# Patient Record
Sex: Male | Born: 2005
Health system: Southern US, Community
[De-identification: ages and names within clinical notes are randomized; demographics above are authoritative.]

---
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
## 2023-02-22 ENCOUNTER — Other Ambulatory Visit: Payer: Self-pay

## 2023-02-22 DIAGNOSIS — H6993 Unspecified Eustachian tube disorder, bilateral: Secondary | ICD-10-CM | POA: Diagnosis not present

## 2023-02-22 DIAGNOSIS — R030 Elevated blood-pressure reading, without diagnosis of hypertension: Secondary | ICD-10-CM | POA: Diagnosis not present

## 2023-02-22 DIAGNOSIS — J309 Allergic rhinitis, unspecified: Secondary | ICD-10-CM | POA: Diagnosis not present

## 2023-02-22 DIAGNOSIS — R0982 Postnasal drip: Secondary | ICD-10-CM | POA: Diagnosis not present

## 2023-02-22 MED ORDER — MONTELUKAST SODIUM 10 MG PO TABS
10.0000 mg | ORAL_TABLET | Freq: Every day | ORAL | 3 refills | Status: DC
  Filled 2023-02-22: qty 90, 90d supply, fill #0

## 2023-04-26 ENCOUNTER — Other Ambulatory Visit: Payer: Self-pay

## 2023-04-26 DIAGNOSIS — R42 Dizziness and giddiness: Secondary | ICD-10-CM | POA: Diagnosis not present

## 2023-04-26 DIAGNOSIS — H938X3 Other specified disorders of ear, bilateral: Secondary | ICD-10-CM | POA: Diagnosis not present

## 2023-04-26 DIAGNOSIS — R599 Enlarged lymph nodes, unspecified: Secondary | ICD-10-CM | POA: Diagnosis not present

## 2023-04-26 MED ORDER — PREDNISONE 10 MG PO TABS
10.0000 mg | ORAL_TABLET | Freq: Every day | ORAL | 0 refills | Status: DC
  Filled 2023-04-26: qty 10, 10d supply, fill #0

## 2023-05-10 ENCOUNTER — Other Ambulatory Visit: Payer: Self-pay

## 2023-05-11 ENCOUNTER — Other Ambulatory Visit: Payer: Self-pay

## 2023-05-11 DIAGNOSIS — H6983 Other specified disorders of Eustachian tube, bilateral: Secondary | ICD-10-CM | POA: Diagnosis not present

## 2023-05-11 DIAGNOSIS — J302 Other seasonal allergic rhinitis: Secondary | ICD-10-CM | POA: Diagnosis not present

## 2023-05-11 DIAGNOSIS — R591 Generalized enlarged lymph nodes: Secondary | ICD-10-CM | POA: Diagnosis not present

## 2023-05-11 IMAGING — DX DG FINGER MIDDLE 2+V*L*
3 series · 3 of 3 positions shown · non-contrast
Comparison: None Available.

CLINICAL DATA: Middle finger laceration.  Nail bed injury.

EXAM:
LEFT MIDDLE FINGER 2+V

[2. finger lat]
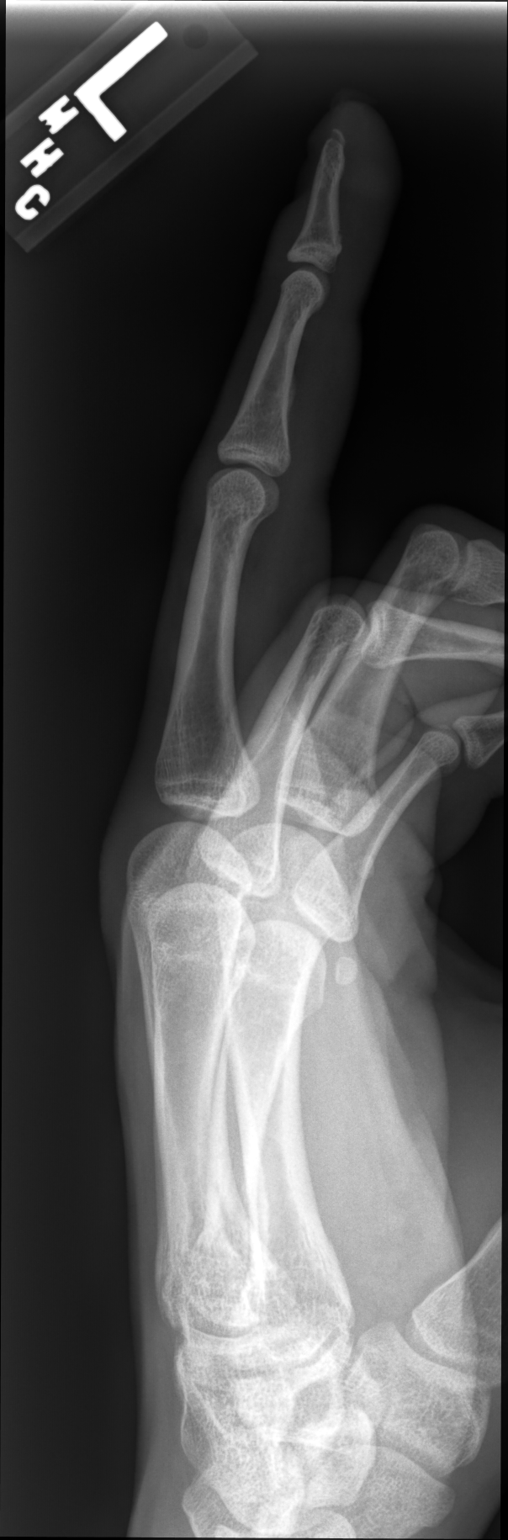

[finger pa]
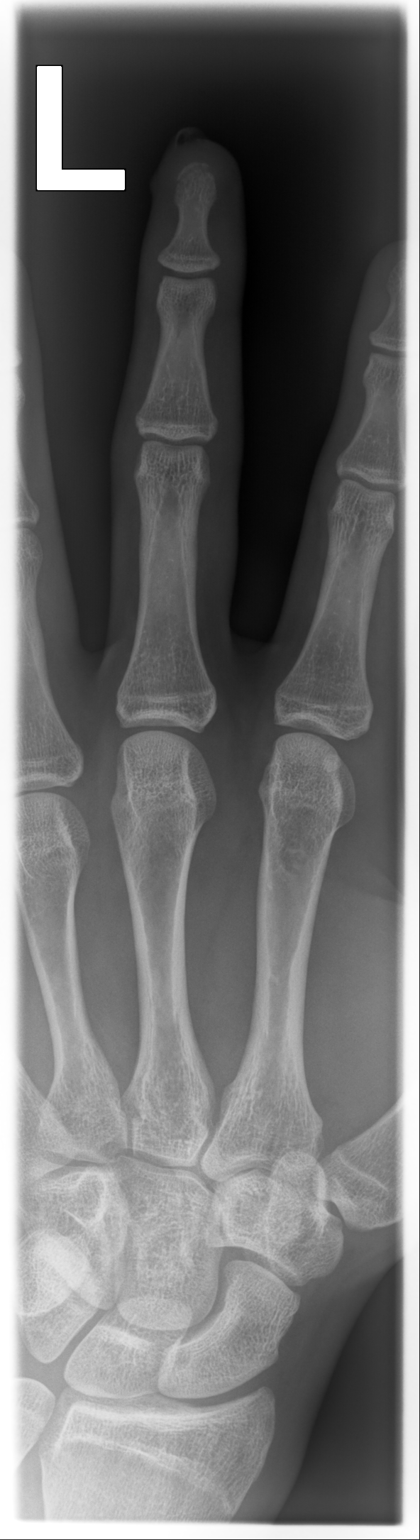

[finger mlo]
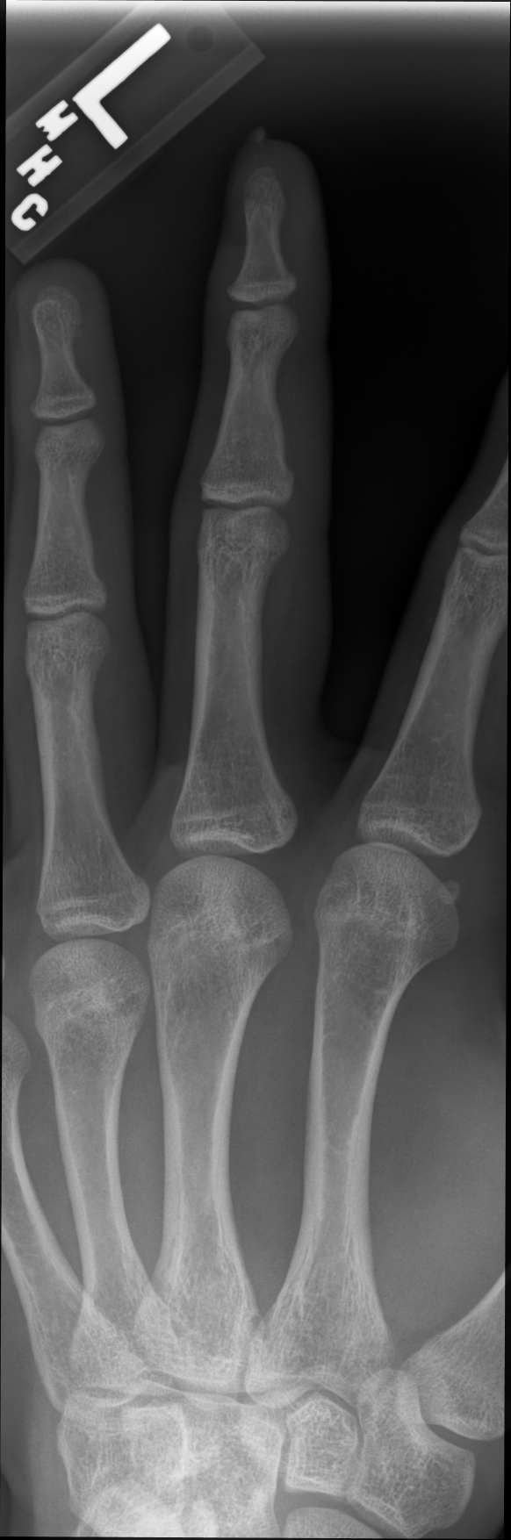

[3 of 3 positions shown; findings below may reference images not displayed]

FINDINGS: There is a small avulsion fracture involving the distal tip of the
distal 3rd phalanx. Overlying soft tissue irregularity involving the
nail bed. No evidence of foreign body or soft tissue emphysema. No
other evidence of acute fracture or dislocation.
IMPRESSION: Nondisplaced avulsion fracture of the tip of the distal 3rd phalanx
which may be open based on history. No evidence of foreign body.

## 2023-05-11 MED ORDER — PREDNISONE 10 MG PO TABS
ORAL_TABLET | ORAL | 0 refills | Status: AC
  Filled 2023-05-11: qty 48, 12d supply, fill #0

## 2023-05-11 MED ORDER — FLUTICASONE PROPIONATE 50 MCG/ACT NA SUSP
2.0000 | Freq: Two times a day (BID) | NASAL | 11 refills | Status: DC
  Filled 2023-05-11: qty 48, 90d supply, fill #0

## 2023-05-11 MED ORDER — LEVOCETIRIZINE DIHYDROCHLORIDE 5 MG PO TABS
5.0000 mg | ORAL_TABLET | Freq: Every day | ORAL | 3 refills | Status: DC
  Filled 2023-05-11: qty 90, 90d supply, fill #0

## 2023-05-11 MED ORDER — LEVOCETIRIZINE DIHYDROCHLORIDE 5 MG PO TABS
5.0000 mg | ORAL_TABLET | Freq: Every day | ORAL | 3 refills | Status: AC
  Filled 2023-05-11: qty 90, 90d supply, fill #0

## 2023-05-11 MED ORDER — FLUTICASONE PROPIONATE 50 MCG/ACT NA SUSP
2.0000 | Freq: Two times a day (BID) | NASAL | 11 refills | Status: DC
  Filled 2023-05-11: qty 48, 45d supply, fill #0

## 2023-05-26 DIAGNOSIS — J301 Allergic rhinitis due to pollen: Secondary | ICD-10-CM | POA: Diagnosis not present

## 2023-06-29 DIAGNOSIS — R591 Generalized enlarged lymph nodes: Secondary | ICD-10-CM | POA: Diagnosis not present

## 2023-06-29 DIAGNOSIS — H6983 Other specified disorders of Eustachian tube, bilateral: Secondary | ICD-10-CM | POA: Diagnosis not present

## 2023-06-30 ENCOUNTER — Other Ambulatory Visit: Payer: Self-pay | Admitting: Student

## 2023-06-30 DIAGNOSIS — R590 Localized enlarged lymph nodes: Secondary | ICD-10-CM

## 2023-08-25 DIAGNOSIS — J069 Acute upper respiratory infection, unspecified: Secondary | ICD-10-CM | POA: Diagnosis not present

## 2023-08-26 ENCOUNTER — Other Ambulatory Visit: Payer: Self-pay

## 2023-08-26 DIAGNOSIS — H60331 Swimmer's ear, right ear: Secondary | ICD-10-CM | POA: Diagnosis not present

## 2023-08-26 MED ORDER — AMOXICILLIN-POT CLAVULANATE 875-125 MG PO TABS
1.0000 | ORAL_TABLET | Freq: Two times a day (BID) | ORAL | 0 refills | Status: DC
  Filled 2023-08-26: qty 20, 10d supply, fill #0

## 2023-08-26 MED ORDER — CIPROFLOXACIN-DEXAMETHASONE 0.3-0.1 % OT SUSP
4.0000 [drp] | Freq: Two times a day (BID) | OTIC | 0 refills | Status: DC
  Filled 2023-08-26: qty 7.5, 7d supply, fill #0

## 2023-08-26 MED ORDER — PREDNISONE 10 MG PO TABS
10.0000 mg | ORAL_TABLET | Freq: Every day | ORAL | 0 refills | Status: DC
  Filled 2023-08-26: qty 10, 10d supply, fill #0

## 2024-08-05 ENCOUNTER — Ambulatory Visit: Payer: Self-pay | Admitting: Emergency Medicine

## 2024-08-05 ENCOUNTER — Ambulatory Visit
Admission: EM | Admit: 2024-08-05 | Discharge: 2024-08-05 | Disposition: A | Discharge status: Home / Self Care | Attending: Emergency Medicine | Admitting: Emergency Medicine

## 2024-08-05 ENCOUNTER — Ambulatory Visit (INDEPENDENT_AMBULATORY_CARE_PROVIDER_SITE_OTHER)

## 2024-08-05 ENCOUNTER — Other Ambulatory Visit: Payer: Self-pay

## 2024-08-05 DIAGNOSIS — S91012A Laceration without foreign body, left ankle, initial encounter: Secondary | ICD-10-CM

## 2024-08-05 DIAGNOSIS — T07XXXA Unspecified multiple injuries, initial encounter: Secondary | ICD-10-CM

## 2024-08-05 MED ORDER — LIDOCAINE-EPINEPHRINE-TETRACAINE (LET) TOPICAL GEL
6.0000 mL | Freq: Once | TOPICAL | Status: AC
  Administered 2024-08-05: 6 mL via TOPICAL

## 2024-08-05 MED ORDER — AMOXICILLIN-POT CLAVULANATE 875-125 MG PO TABS
1.0000 | ORAL_TABLET | Freq: Two times a day (BID) | ORAL | 0 refills | Status: AC
  Filled 2024-08-05: qty 14, 7d supply, fill #0

## 2024-08-05 MED ORDER — IBUPROFEN 600 MG PO TABS
600.0000 mg | ORAL_TABLET | Freq: Three times a day (TID) | ORAL | 0 refills | Status: AC | PRN
  Filled 2024-08-05 – 2024-08-06 (×2): qty 30, 10d supply, fill #0
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
## 2024-08-05 NOTE — Discharge Instructions (Addendum)
 ASSESSMENT:You have had a wound repaired by suturing or stapling.  Proper wound care will minimize the risk of infection.  Leave the dressing we put on in place for 24 hours. Keep the dressing clean and dry during this time. After that you may change the dressing daily.  You may wash with soap and water, bathe and shower after the first 24 hours, but we recommend not swimming or submerging the wound in water for prolonged periods.  You may apply a loose bandage with topical antibiotic ointment until the sutures are removed.  (Recommend Bacitracin or Polysporin--do not use Neosporin since this can cause an allergic reaction).  If the skin around the wound looks white, this means it is too wet.  You may leave the dressing off during the night to let it get some air, but keep it covered during the day.    If your wound has been repaired, the sutures or staples should be removed.  The following is the usual time table for removal:  Face--3-5 days Head and face--5 to 7 days. Trunk and extremities--10 days. Hands and feet--10 days to 2 weeks. Do not try to remove the sutures yourself.  Come back here or see your doctor to have them removed.  Come back in 10 to 12 days for suture removal.  Any sign of infection should prompt you to come back sooner for a recheck.  This includes: Redness and heat streaking from the wound, Swelling, Increasing pain, Fever >100.4, and chills, Pus draining from the wound.  Finish the antibiotics  if you were prescribed them. Not all  lacerations require antibiotics.  1000 mg of Tylenol combined with 600 mg of ibuprofen  3-4 times a day as needed for pain.  Wear the brace until sutures are removed Go to www.goodrx.com to look up your medications. This will give you a list of where you can find your prescriptions at the most affordable prices. Or ask the pharmacist what the cash price is, or if they have any other discount programs available to help make your medication more  affordable. This can be less expensive than what you would pay with insurance.
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
## 2024-08-06 ENCOUNTER — Other Ambulatory Visit: Payer: Self-pay

## 2024-08-15 ENCOUNTER — Other Ambulatory Visit: Payer: Self-pay

## 2024-08-15 ENCOUNTER — Ambulatory Visit
Admission: RE | Admit: 2024-08-15 | Discharge: 2024-08-15 | Disposition: A | Discharge status: Home / Self Care | Source: Ambulatory Visit | Attending: Family Medicine | Admitting: Family Medicine

## 2024-08-15 DIAGNOSIS — L03116 Cellulitis of left lower limb: Secondary | ICD-10-CM

## 2024-08-15 DIAGNOSIS — S91012D Laceration without foreign body, left ankle, subsequent encounter: Secondary | ICD-10-CM

## 2024-08-15 MED ORDER — DOXYCYCLINE HYCLATE 100 MG PO CAPS
100.0000 mg | ORAL_CAPSULE | Freq: Two times a day (BID) | ORAL | 0 refills | Status: AC
  Filled 2024-08-15: qty 14, 7d supply, fill #0

## 2024-08-15 NOTE — ED Provider Notes (Signed)
 [facility]    CSN: [id number] Arrival date & time: 08/15/24  0815      History   Chief Complaint No chief complaint on file.   HPI Rick Garner is a 18 y.o. male.   HPI  Rick Garner presents suture removal. Asked by nursing staff to look at the wound as it is looks infected.  Pt reports taking his antibiotics as prescribed.       No past medical history on file.  Patient Active Problem List   Diagnosis Date Noted   Laceration of finger nail bed 04/23/2022   Medial epicondylitis 07/30/2020    No past surgical history on file.     Home Medications    Prior to Admission medications   Medication Sig Start Date End Date Taking? Authorizing Provider  doxycycline  (VIBRAMYCIN ) 100 MG capsule Take 1 capsule (100 mg total) by mouth 2 (two) times daily. 08/15/24  Yes Yer Olivencia, DO  amoxicillin -clavulanate (AUGMENTIN ) 875-125 MG tablet Take 1 tablet by mouth every 12 (twelve) hours. 08/05/24   Van Knee, MD  ibuprofen  (ADVIL ) 600 MG tablet Take 1 tablet (600 mg total) by mouth every 8 (eight) hours as needed. 08/05/24   Mortenson, Ashley, MD  levocetirizine (XYZAL ) 5 MG tablet Take 1 tablet (5 mg total) by mouth daily. 05/11/23     meclizine  (ANTIVERT ) 25 MG tablet Take 1 tablet (25 mg total) by mouth 3 (three) times daily for 10 days 11/18/22     moxifloxacin  (VIGAMOX ) 0.5 % ophthalmic solution Place 1 drop in right eye every hour while awake or as directed 10/01/22     montelukast  (SINGULAIR ) 10 MG tablet Take 1 tablet (10 mg total) by mouth at bedtime. 02/22/23 08/26/23      Family History No family history on file.  Social History Social History   Tobacco Use   Smoking status: Never   Smokeless tobacco: Current  Vaping Use   Vaping status: Never Used  Substance Use Topics   Alcohol use: Yes   Drug use: Never     Allergies   Patient has no known allergies.   Review of Systems Review of Systems :negative unless otherwise stated in HPI.       Physical Exam Triage Vital Signs ED Triage Vitals  Encounter Vitals Group     BP      Girls Systolic BP Percentile      Girls Diastolic BP Percentile      Boys Systolic BP Percentile      Boys Diastolic BP Percentile      Pulse      Resp      Temp      Temp src      SpO2      Weight      Height      Head Circumference      Peak Flow      Pain Score      Pain Loc      Pain Education      Exclude from Growth Chart    No data found.  Updated Vital Signs There were no vitals taken for this visit.  Visual Acuity Right Eye Distance:   Left Eye Distance:   Bilateral Distance:    Right Eye Near:   Left Eye Near:    Bilateral Near:     Physical Exam  GEN: alert, well appearing male, in no acute distress CV: regular rate RESP: no increased work of breathing MSK: no extremity edema,  good ROM of left ankle  NEURO: alert, moves all extremities appropriately SKIN: warmth and erythema streaking from the lower leg laceration, sutures intact, mucoid drainage     UC Treatments / Results  Labs (all labs ordered are listed, but only abnormal results are displayed) Labs Reviewed - No data to display  EKG   Radiology No results found.  Procedures Procedures (including critical care time)  Medications Ordered in UC Medications - No data to display  Initial Impression / Assessment and Plan / UC Course  I have reviewed the triage vital signs and the nursing notes.  Pertinent labs & imaging results that were available during my care of the patient were reviewed by me and considered in my medical decision making (see chart for details).     Patient is a 18 y.o. malewho presents for suture removal.  Overall, patient is well-appearing and well-hydrated.  Vital signs stable.  Rick Garner is afebrile.  Pt had left ankle sutures placed on 08/05/24 here at the urgent care.  He was prescribed Augmentin .  Exam concerning for cellulitis.  Treat with doxycyline BID for 7 days.  Laceration care handout provided. Red flag symptoms and signs discussed. Understanding voiced.    Reviewed expectations regarding course of current medical issues.  All questions asked were answered.  Outlined signs and symptoms indicating need for more acute intervention. Patient verbalized understanding. After Visit Summary given.   Final Clinical Impressions(s) / UC Diagnoses   Final diagnoses:  Laceration of left ankle, subsequent encounter  Cellulitis of left lower extremity     Discharge Instructions      Continue to monitor the area for changes in color, warmth and drainage or if you develop a fever, return to the urgent care. See handout on laceration aftercare.   Stop by the pharmacy to pick up your prescriptions.       ED Prescriptions     Medication Sig Dispense Auth. Provider   doxycycline  (VIBRAMYCIN ) 100 MG capsule Take 1 capsule (100 mg total) by mouth 2 (two) times daily. 14 capsule Syrenity Klepacki, DO      PDMP not reviewed this encounter.              Maaliyah Adolph, DO 08/15/24 [phone]

## 2024-08-15 NOTE — Discharge Instructions (Signed)
 Continue to monitor the area for changes in color, warmth and drainage or if you develop a fever, return to the urgent care. See handout on laceration aftercare.   Stop by the pharmacy to pick up your prescriptions.

## 2024-08-15 NOTE — ED Triage Notes (Signed)
 Patient here for suture removal on right ankle. The area looks infected. Dr Kriste looked at area.
